# Patient Record
Sex: Female | Born: 2005 | Race: White | Hispanic: No | Marital: Single | Smoking: Never smoker
Health system: Southern US, Community
[De-identification: ages and names within clinical notes are randomized; demographics above are authoritative.]

## PROBLEM LIST (undated history)

## (undated) DIAGNOSIS — J45909 Unspecified asthma, uncomplicated: Secondary | ICD-10-CM

---
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
## 2023-02-25 DIAGNOSIS — Z68.41 Body mass index (BMI) pediatric, 85th percentile to less than 95th percentile for age: Secondary | ICD-10-CM | POA: Diagnosis not present

## 2023-02-25 DIAGNOSIS — O3680X0 Pregnancy with inconclusive fetal viability, not applicable or unspecified: Secondary | ICD-10-CM | POA: Diagnosis not present

## 2023-02-25 DIAGNOSIS — Z3201 Encounter for pregnancy test, result positive: Secondary | ICD-10-CM | POA: Diagnosis not present

## 2023-02-25 DIAGNOSIS — O99210 Obesity complicating pregnancy, unspecified trimester: Secondary | ICD-10-CM | POA: Diagnosis not present

## 2023-02-25 DIAGNOSIS — E669 Obesity, unspecified: Secondary | ICD-10-CM | POA: Diagnosis not present

## 2023-02-25 DIAGNOSIS — Z3689 Encounter for other specified antenatal screening: Secondary | ICD-10-CM | POA: Diagnosis not present

## 2023-02-25 DIAGNOSIS — Z3A10 10 weeks gestation of pregnancy: Secondary | ICD-10-CM | POA: Diagnosis not present

## 2023-02-25 DIAGNOSIS — N912 Amenorrhea, unspecified: Secondary | ICD-10-CM | POA: Diagnosis not present

## 2023-03-25 DIAGNOSIS — Z3689 Encounter for other specified antenatal screening: Secondary | ICD-10-CM | POA: Diagnosis not present

## 2023-04-28 DIAGNOSIS — Z3689 Encounter for other specified antenatal screening: Secondary | ICD-10-CM | POA: Diagnosis not present

## 2023-04-28 DIAGNOSIS — Z3A19 19 weeks gestation of pregnancy: Secondary | ICD-10-CM | POA: Diagnosis not present

## 2023-05-27 DIAGNOSIS — Z3689 Encounter for other specified antenatal screening: Secondary | ICD-10-CM | POA: Diagnosis not present

## 2023-05-27 DIAGNOSIS — Z362 Encounter for other antenatal screening follow-up: Secondary | ICD-10-CM | POA: Diagnosis not present

## 2023-05-27 DIAGNOSIS — Z3A23 23 weeks gestation of pregnancy: Secondary | ICD-10-CM | POA: Diagnosis not present

## 2023-06-24 DIAGNOSIS — O26892 Other specified pregnancy related conditions, second trimester: Secondary | ICD-10-CM | POA: Diagnosis not present

## 2023-06-24 DIAGNOSIS — Z6791 Unspecified blood type, Rh negative: Secondary | ICD-10-CM | POA: Diagnosis not present

## 2023-06-24 DIAGNOSIS — Z3689 Encounter for other specified antenatal screening: Secondary | ICD-10-CM | POA: Diagnosis not present

## 2023-08-20 DIAGNOSIS — Z3689 Encounter for other specified antenatal screening: Secondary | ICD-10-CM | POA: Diagnosis not present

## 2023-08-20 DIAGNOSIS — Z3685 Encounter for antenatal screening for Streptococcus B: Secondary | ICD-10-CM | POA: Diagnosis not present

## 2023-09-08 DIAGNOSIS — M79601 Pain in right arm: Secondary | ICD-10-CM | POA: Diagnosis not present
